# Patient Record
Sex: Female | Born: 1995 | Race: Black or African American | Hispanic: No | Marital: Single | Smoking: Never smoker
Health system: Southern US, Community
[De-identification: ages and names within clinical notes are randomized; demographics above are authoritative.]

## PROBLEM LIST (undated history)

## (undated) DIAGNOSIS — J45909 Unspecified asthma, uncomplicated: Secondary | ICD-10-CM

## (undated) DIAGNOSIS — J302 Other seasonal allergic rhinitis: Secondary | ICD-10-CM

## (undated) HISTORY — PX: TYMPANOSTOMY TUBE PLACEMENT: SHX32

---
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
## 2015-06-08 ENCOUNTER — Emergency Department (HOSPITAL_COMMUNITY)
Admission: EM | Admit: 2015-06-08 | Discharge: 2015-06-08 | Disposition: A | Discharge status: Home / Self Care | Payer: Commercial Managed Care - HMO | Attending: Emergency Medicine | Admitting: Emergency Medicine

## 2015-06-08 ENCOUNTER — Encounter (HOSPITAL_COMMUNITY): Payer: Self-pay | Admitting: Emergency Medicine

## 2015-06-08 ENCOUNTER — Emergency Department (HOSPITAL_COMMUNITY): Payer: Commercial Managed Care - HMO

## 2015-06-08 DIAGNOSIS — J45901 Unspecified asthma with (acute) exacerbation: Secondary | ICD-10-CM | POA: Diagnosis not present

## 2015-06-08 DIAGNOSIS — Z880 Allergy status to penicillin: Secondary | ICD-10-CM | POA: Diagnosis not present

## 2015-06-08 DIAGNOSIS — Z79899 Other long term (current) drug therapy: Secondary | ICD-10-CM | POA: Diagnosis not present

## 2015-06-08 DIAGNOSIS — Z7952 Long term (current) use of systemic steroids: Secondary | ICD-10-CM | POA: Insufficient documentation

## 2015-06-08 DIAGNOSIS — R000 Tachycardia, unspecified: Secondary | ICD-10-CM | POA: Diagnosis not present

## 2015-06-08 DIAGNOSIS — J45909 Unspecified asthma, uncomplicated: Secondary | ICD-10-CM | POA: Diagnosis present

## 2015-06-08 HISTORY — DX: Other seasonal allergic rhinitis: J30.2

## 2015-06-08 MED ORDER — IPRATROPIUM-ALBUTEROL 0.5-2.5 (3) MG/3ML IN SOLN
3.0000 mL | Freq: Once | RESPIRATORY_TRACT | Status: AC
  Administered 2015-06-08: 3 mL via RESPIRATORY_TRACT
  Filled 2015-06-08 (×2): qty 3

## 2015-06-08 MED ORDER — DEXAMETHASONE 4 MG PO TABS
10.0000 mg | ORAL_TABLET | Freq: Once | ORAL | Status: AC
  Administered 2015-06-08: 10 mg via ORAL
  Filled 2015-06-08: qty 2

## 2015-06-08 MED ORDER — ALBUTEROL SULFATE (2.5 MG/3ML) 0.083% IN NEBU: 2.5000 mg | INHALATION_SOLUTION | Freq: Four times a day (QID) | RESPIRATORY_TRACT | Status: AC | PRN

## 2015-06-08 NOTE — ED Notes (Signed)
Pt reports asthma flare-up since last night. Tried to use home nebulizer, but was out of nebulizer solution. Has been using home inhaler. No audible wheezing. Speaking in full sentences. Was worse this am before using home inhaler.

## 2015-06-08 NOTE — Discharge Instructions (Signed)
Asthma, Adult Asthma is a recurring condition in which the airways tighten and narrow. Asthma can make it difficult to breathe. It can cause coughing, wheezing, and shortness of breath. Asthma episodes, also called asthma attacks, range from minor to life-threatening. Asthma cannot be cured, but medicines and lifestyle changes can help control it. CAUSES Asthma is believed to be caused by inherited (genetic) and environmental factors, but its exact cause is unknown. Asthma may be triggered by allergens, lung infections, or irritants in the air. Asthma triggers are different for each person. Common triggers include:   Animal dander.  Dust mites.  Cockroaches.  Pollen from trees or grass.  Mold.  Smoke.  Air pollutants such as dust, household cleaners, hair sprays, aerosol sprays, paint fumes, strong chemicals, or strong odors.  Cold air, weather changes, and winds (which increase molds and pollens in the air).  Strong emotional expressions such as crying or laughing hard.  Stress.  Certain medicines (such as aspirin) or types of drugs (such as beta-blockers).  Sulfites in foods and drinks. Foods and drinks that may contain sulfites include dried fruit, potato chips, and sparkling grape juice.  Infections or inflammatory conditions such as the flu, a cold, or an inflammation of the nasal membranes (rhinitis).  Gastroesophageal reflux disease (GERD).  Exercise or strenuous activity. SYMPTOMS Symptoms may occur immediately after asthma is triggered or many hours later. Symptoms include:  Wheezing.  Excessive nighttime or early morning coughing.  Frequent or severe coughing with a common cold.  Chest tightness.  Shortness of breath. DIAGNOSIS  The diagnosis of asthma is made by a review of your medical history and a physical exam. Tests may also be performed. These may include:  Lung function studies. These tests show how much air you breathe in and out.  Allergy  tests.  Imaging tests such as X-rays. TREATMENT  Asthma cannot be cured, but it can usually be controlled. Treatment involves identifying and avoiding your asthma triggers. It also involves medicines. There are 2 classes of medicine used for asthma treatment:   Controller medicines. These prevent asthma symptoms from occurring. They are usually taken every day.  Reliever or rescue medicines. These quickly relieve asthma symptoms. They are used as needed and provide short-term relief. Your health care provider will help you create an asthma action plan. An asthma action plan is a written plan for managing and treating your asthma attacks. It includes a list of your asthma triggers and how they may be avoided. It also includes information on when medicines should be taken and when their dosage should be changed. An action plan may also involve the use of a device called a peak flow meter. A peak flow meter measures how well the lungs are working. It helps you monitor your condition. HOME CARE INSTRUCTIONS   Take medicines only as directed by your health care provider. Speak with your health care provider if you have questions about how or when to take the medicines.  Use a peak flow meter as directed by your health care provider. Record and keep track of readings.  Understand and use the action plan to help minimize or stop an asthma attack without needing to seek medical care.  Control your home environment in the following ways to help prevent asthma attacks:  Do not smoke. Avoid being exposed to secondhand smoke.  Change your heating and air conditioning filter regularly.  Limit your use of fireplaces and wood stoves.  Get rid of pests (such as roaches   and mice) and their droppings.  Throw away plants if you see mold on them.  Clean your floors and dust regularly. Use unscented cleaning products.  Try to have someone else vacuum for you regularly. Stay out of rooms while they are  being vacuumed and for a short while afterward. If you vacuum, use a dust mask from a hardware store, a double-layered or microfilter vacuum cleaner bag, or a vacuum cleaner with a HEPA filter.  Replace carpet with wood, tile, or vinyl flooring. Carpet can trap dander and dust.  Use allergy-proof pillows, mattress covers, and box spring covers.  Wash bed sheets and blankets every week in hot water and dry them in a dryer.  Use blankets that are made of polyester or cotton.  Clean bathrooms and kitchens with bleach. If possible, have someone repaint the walls in these rooms with mold-resistant paint. Keep out of the rooms that are being cleaned and painted.  Wash hands frequently. SEEK MEDICAL CARE IF:   You have wheezing, shortness of breath, or a cough even if taking medicine to prevent attacks.  The colored mucus you cough up (sputum) is thicker than usual.  Your sputum changes from clear or white to yellow, green, gray, or bloody.  You have any problems that may be related to the medicines you are taking (such as a rash, itching, swelling, or trouble breathing).  You are using a reliever medicine more than 2-3 times per week.  Your peak flow is still at 50-79% of your personal best after following your action plan for 1 hour.  You have a fever. SEEK IMMEDIATE MEDICAL CARE IF:   You seem to be getting worse and are unresponsive to treatment during an asthma attack.  You are short of breath even at rest.  You get short of breath when doing very Amanda Campbell physical activity.  You have difficulty eating, drinking, or talking due to asthma symptoms.  You develop chest pain.  You develop a fast heartbeat.  You have a bluish color to your lips or fingernails.  You are light-headed, dizzy, or faint.  Your peak flow is less than 50% of your personal best.   This information is not intended to replace advice given to you by your health care provider. Make sure you discuss any  questions you have with your health care provider.   Document Released: 02/16/2005 Document Revised: 11/07/2014 Document Reviewed: 09/15/2012 Elsevier Interactive Patient Education 2016 [employer]  

## 2015-06-08 NOTE — ED Notes (Signed)
Pt also reports hx of PNA with asthma flare-ups.

## 2015-06-08 NOTE — ED Provider Notes (Signed)
CSN: [id number]     Arrival date & time 06/08/15  4098 History   First MD Initiated Contact with Patient 06/08/15 0915     Chief Complaint  Patient presents with  . Asthma     (Consider location/radiation/quality/duration/timing/severity/associated sxs/prior Treatment) HPI Comments: 20 year old female with history of asthma and seasonal allergies who presents with asthma exacerbation. The patient states that yesterday she began feeling more short of breath consistent with asthma exacerbation. She was out of her nebulizer solution and has been using her home inhaler but felt more short of breath this morning around 7 AM which is what prompted her to come in. Her parents are concerned because she has a history of pneumonia in the setting of asthma. She endorses nasal congestion and seasonal allergy symptoms for which she is taking Claritin. She denies any fevers, significant cough, vomiting, or recent illness.  Patient is a 20 y.o. female presenting with asthma. The history is provided by the patient.  Asthma    Past Medical History  Diagnosis Date  . Asthma   . Seasonal allergies    No past surgical history on file. No family history on file. Social History  Substance Use Topics  . Smoking status: Never Smoker   . Smokeless tobacco: Not on file  . Alcohol Use: No   OB History    No data available     Review of Systems 10 Systems reviewed and are negative for acute change except as noted in the HPI.    Allergies  Amoxicillin  Home Medications   Prior to Admission medications   Medication Sig Start Date End Date Taking? Authorizing Provider  albuterol (PROVENTIL HFA;VENTOLIN HFA) 108 (90 BASE) MCG/ACT inhaler Inhale into the lungs every 6 (six) hours as needed for wheezing or shortness of breath.    Historical Provider, MD  cetirizine (ZYRTEC) 10 MG chewable tablet Chew 10 mg by mouth daily.    Historical Provider, MD  predniSONE (DELTASONE) 20 MG tablet Take 2 tablets (40  mg total) by mouth daily. 04/05/14   Charm Rings, MD   BP 126/82 mmHg  Pulse 97  Temp(Src) 98.3 F (36.8 C) (Oral)  Resp 20  SpO2 100% Physical Exam  Constitutional: She is oriented to person, place, and time. She appears well-developed and well-nourished. No distress.  HENT:  Head: Normocephalic and atraumatic.  Mouth/Throat: No oropharyngeal exudate.  Moist mucous membranes, + nasal congestion  Eyes: Conjunctivae are normal. Pupils are equal, round, and reactive to light.  Neck: Neck supple.  Cardiovascular: Regular rhythm and normal heart sounds.  Tachycardia present.   No murmur heard. Pulmonary/Chest: Effort normal and breath sounds normal. No respiratory distress.  Abdominal: Soft. Bowel sounds are normal. She exhibits no distension. There is no tenderness.  Musculoskeletal: She exhibits no edema.  Neurological: She is alert and oriented to person, place, and time.  Fluent speech  Skin: Skin is warm and dry.  Psychiatric: She has a normal mood and affect. Judgment normal.  Nursing note and vitals reviewed.   ED Course  Procedures (including critical care time) Labs Review Labs Reviewed - No data to display  Imaging Review No results found.   Medications  ipratropium-albuterol (DUONEB) 0.5-2.5 (3) MG/3ML nebulizer solution 3 mL (not administered)  dexamethasone (DECADRON) tablet 10 mg (not administered)     MDM   Final diagnoses:  None   Pt w/ hx asthma p/w wheezing and SOB c/w previous asthma flares. She was well-appearing on exam with normal vital  signs at triage. During my examination, she was tachycardic but she had used albuterol 1 hour prior and did state that she was nervous. She had normal work of breathing, no obvious wheezing but patient does endorse having audible wheezes earlier this morning prior to using the albuterol. Gave the patient DuoNeb and Decadron and obtained chest x-ray per patient request. CXR unremarkable.  Provided with refill on  albuterol nebulizer and instructed on using spacer when she does use her inhaler. Discussed supportive care instructions as well as return precautions. Patient voiced understanding and was discharged in satisfactory condition.   Laurence Spatesachel Morgan Little, MD 06/08/15 [phone]

## 2015-07-03 ENCOUNTER — Encounter (HOSPITAL_COMMUNITY): Payer: Self-pay | Admitting: *Deleted

## 2015-07-03 ENCOUNTER — Emergency Department (HOSPITAL_COMMUNITY)
Admission: EM | Admit: 2015-07-03 | Discharge: 2015-07-03 | Disposition: A | Discharge status: Home / Self Care | Payer: Commercial Managed Care - HMO | Attending: Emergency Medicine | Admitting: Emergency Medicine

## 2015-07-03 DIAGNOSIS — R062 Wheezing: Secondary | ICD-10-CM | POA: Diagnosis present

## 2015-07-03 DIAGNOSIS — H6691 Otitis media, unspecified, right ear: Secondary | ICD-10-CM | POA: Diagnosis not present

## 2015-07-03 DIAGNOSIS — Z880 Allergy status to penicillin: Secondary | ICD-10-CM | POA: Insufficient documentation

## 2015-07-03 DIAGNOSIS — J45901 Unspecified asthma with (acute) exacerbation: Secondary | ICD-10-CM | POA: Diagnosis not present

## 2015-07-03 DIAGNOSIS — Z79899 Other long term (current) drug therapy: Secondary | ICD-10-CM | POA: Diagnosis not present

## 2015-07-03 MED ORDER — CEFDINIR 300 MG PO CAPS: 300.0000 mg | ORAL_CAPSULE | Freq: Two times a day (BID) | ORAL | Status: AC

## 2015-07-03 MED ORDER — PREDNISONE 20 MG PO TABS
60.0000 mg | ORAL_TABLET | Freq: Once | ORAL | Status: AC
  Administered 2015-07-03: 60 mg via ORAL
  Filled 2015-07-03: qty 3

## 2015-07-03 MED ORDER — ALBUTEROL SULFATE (2.5 MG/3ML) 0.083% IN NEBU
5.0000 mg | INHALATION_SOLUTION | Freq: Once | RESPIRATORY_TRACT | Status: AC
  Administered 2015-07-03: 5 mg via RESPIRATORY_TRACT
  Filled 2015-07-03: qty 6

## 2015-07-03 MED ORDER — IPRATROPIUM BROMIDE 0.02 % IN SOLN
0.5000 mg | Freq: Once | RESPIRATORY_TRACT | Status: AC
  Administered 2015-07-03: 0.5 mg via RESPIRATORY_TRACT
  Filled 2015-07-03: qty 2.5

## 2015-07-03 MED ORDER — PREDNISONE 20 MG PO TABS: 40.0000 mg | ORAL_TABLET | Freq: Every day | ORAL | Status: AC

## 2015-07-03 NOTE — ED Notes (Signed)
Pt complains of asthma exacerbation for the past 2 days. Pt states she tried a home nebulizer treatment today at 1015, which she states helped but did not completely relieve her shortness of breath. Pt also complains of pain and fullness in her right ear starting this morning.

## 2015-07-03 NOTE — ED Provider Notes (Signed)
CSN: [id number]     Arrival date & time 07/03/15  1535 History  By signing my name below, I, Iona Beard, attest that this documentation has been prepared under the direction and in the presence of Sharilyn Sites, PA-C.  Electronically Signed: Iona Beard, ED Scribe 07/03/2015 at 5:14 PM.   Chief Complaint  Patient presents with  . Asthma    The history is provided by the patient. No language interpreter was used.   HPI Comments: Amanda Campbell is a 20 y.o. female with PMHx of asthma and seasonal allergies who presents to the Emergency Department complaining of gradual onset, worsening, wheezing and shortness of breath, ongoing for two days. Pt also complains of right ear pain beginning this morning. Pt states that her wheezing kept her from sleeping last night. No other associated symptoms noted. Pt used albuterol inhaler 5 times last night with no relief to symptoms. She also used nebulizer treatment at home last night and this morning with minimal relief to symptoms. No other worsening or alleviating factors noted. Pt denies chest pain, cough, sore throat, or any other pertinent symptoms.   Past Medical History  Diagnosis Date  . Asthma   . Seasonal allergies    History reviewed. No pertinent past surgical history. No family history on file. Social History  Substance Use Topics  . Smoking status: Never Smoker   . Smokeless tobacco: None  . Alcohol Use: No   OB History    No data available     Review of Systems  HENT: Positive for ear pain. Negative for sore throat.   Respiratory: Positive for shortness of breath and wheezing. Negative for cough.   Cardiovascular: Negative for chest pain.  All other systems reviewed and are negative.   Allergies  Amoxicillin  Home Medications   Prior to Admission medications   Medication Sig Start Date End Date Taking? Authorizing Provider  albuterol (PROVENTIL HFA;VENTOLIN HFA) 108 (90 BASE) MCG/ACT inhaler Inhale 1 puff into  the lungs every 6 (six) hours as needed for wheezing or shortness of breath.    Yes Historical Provider, MD  albuterol (PROVENTIL) (2.5 MG/3ML) 0.083% nebulizer solution Take 3 mLs (2.5 mg total) by nebulization every 6 (six) hours as needed for wheezing or shortness of breath. 06/08/15  Yes Laurence Spates, MD  cetirizine (ZYRTEC) 10 MG chewable tablet Chew 10 mg by mouth daily as needed for allergies.    Yes Historical Provider, MD  loratadine-pseudoephedrine (CLARITIN-D 24-HOUR) 10-240 MG 24 hr tablet Take 1 tablet by mouth daily as needed for allergies.    Yes Historical Provider, MD  predniSONE (DELTASONE) 20 MG tablet Take 2 tablets (40 mg total) by mouth daily. Patient not taking: Reported on 07/03/2015 04/05/14   Charm Rings, MD   BP 122/76 mmHg  Pulse 100  Temp(Src) 98.7 F (37.1 C) (Oral)  Resp 20  SpO2 95%  LMP 06/09/2015 Physical Exam  Constitutional: She is oriented to person, place, and time. She appears well-developed and well-nourished. No distress.  HENT:  Head: Normocephalic and atraumatic.  Right Ear: No mastoid tenderness. Tympanic membrane is erythematous.  Left Ear: Tympanic membrane and ear canal normal.  Nose: Mucosal edema present.  Mouth/Throat: Uvula is midline, oropharynx is clear and moist and mucous membranes are normal. No oropharyngeal exudate, posterior oropharyngeal edema, posterior oropharyngeal erythema or tonsillar abscesses.  Right otitis media, no mastoid tenderness Left ear normal + nasal congestion  Eyes: Conjunctivae and EOM are normal. Pupils are equal,[id number], and  reactive to light.  Neck: Normal range of motion.  Cardiovascular: Normal rate, regular rhythm and normal heart sounds.   Pulmonary/Chest: Effort normal. She has wheezes.  Mild end-expiratory wheezes noted, no distress, speaking in full sentences without difficulty  Abdominal: Soft. Bowel sounds are normal.  Musculoskeletal: Normal range of motion.  Neurological: She is alert and  oriented to person, place, and time.  Skin: Skin is warm and dry. She is not diaphoretic.  Psychiatric: She has a normal mood and affect.  Nursing note and vitals reviewed.   ED Course  Procedures (including critical care time) DIAGNOSTIC STUDIES: Oxygen Saturation is 95% on RA, adequate by my interpretation.    COORDINATION OF CARE: 5:13 PM Discussed treatment plan which includes proventil, atrovent, and deltasone with pt at bedside and pt agreed to plan.  Labs Review Labs Reviewed - No data to display  Imaging Review No results found.    EKG Interpretation None      MDM   Final diagnoses:  Asthma attack  Acute ear infection, right   20 year old female here with asthma exacerbation for the past 2 days. Allergies home nebulizer without improvement. She was given nebulizer treatment on arrival to ED with some improvement. She has mild end-expiratory wheezes noted on exam. Her vital signs are stable on room air and she is in no acute respiratory distress. She also has exam findings consistent with right otitis media. Patient was given additional albuterol and Atrovent nebulization treatment and dose of prednisone with resolution of symptoms. Her vital signs remained stable. She does have a low-grade tachycardia which I suspect is from the albuterol. Will discharge home with Omnicef and prednisone.  Recommend to continue home nebulizer treatments every 4-6 hours as needed for shortness of breath. Use albuterol inhaler for rescue. Follow-up with PCP.  Discussed plan with patient, he/she acknowledged understanding and agreed with plan of care.  Return precautions given for new or worsening symptoms.  I personally performed the services described in this documentation, which was scribed in my presence. The recorded information has been reviewed and is accurate.  Garlon HatchetLisa M Toni Hoffmeister, PA-C 07/03/15 [phone]

## 2015-07-03 NOTE — Discharge Instructions (Signed)
Take the prescribed medication as directed.  Continue using albuterol nebulizer treatments every 4-6 hours at home as needed.  Use rescue inhaler for break through. Follow-up with your primary care doctor. Return to the ED for new or worsening symptoms.

## 2015-09-12 NOTE — ED Provider Notes (Signed)
Medical screening examination/treatment/procedure(s) were performed by non-physician practitioner and as supervising physician I was immediately available for consultation/collaboration.   EKG Interpretation None       Cailen Mihalik, MD 09/12/15 1304 

## 2016-10-19 IMAGING — CR DG CHEST 2V
2 series · 2 of 2 positions shown · non-contrast
Comparison: April 05, 2014

CLINICAL DATA: Shortness of breath and cough

EXAM:
CHEST  2 VIEW

[w chest pa]
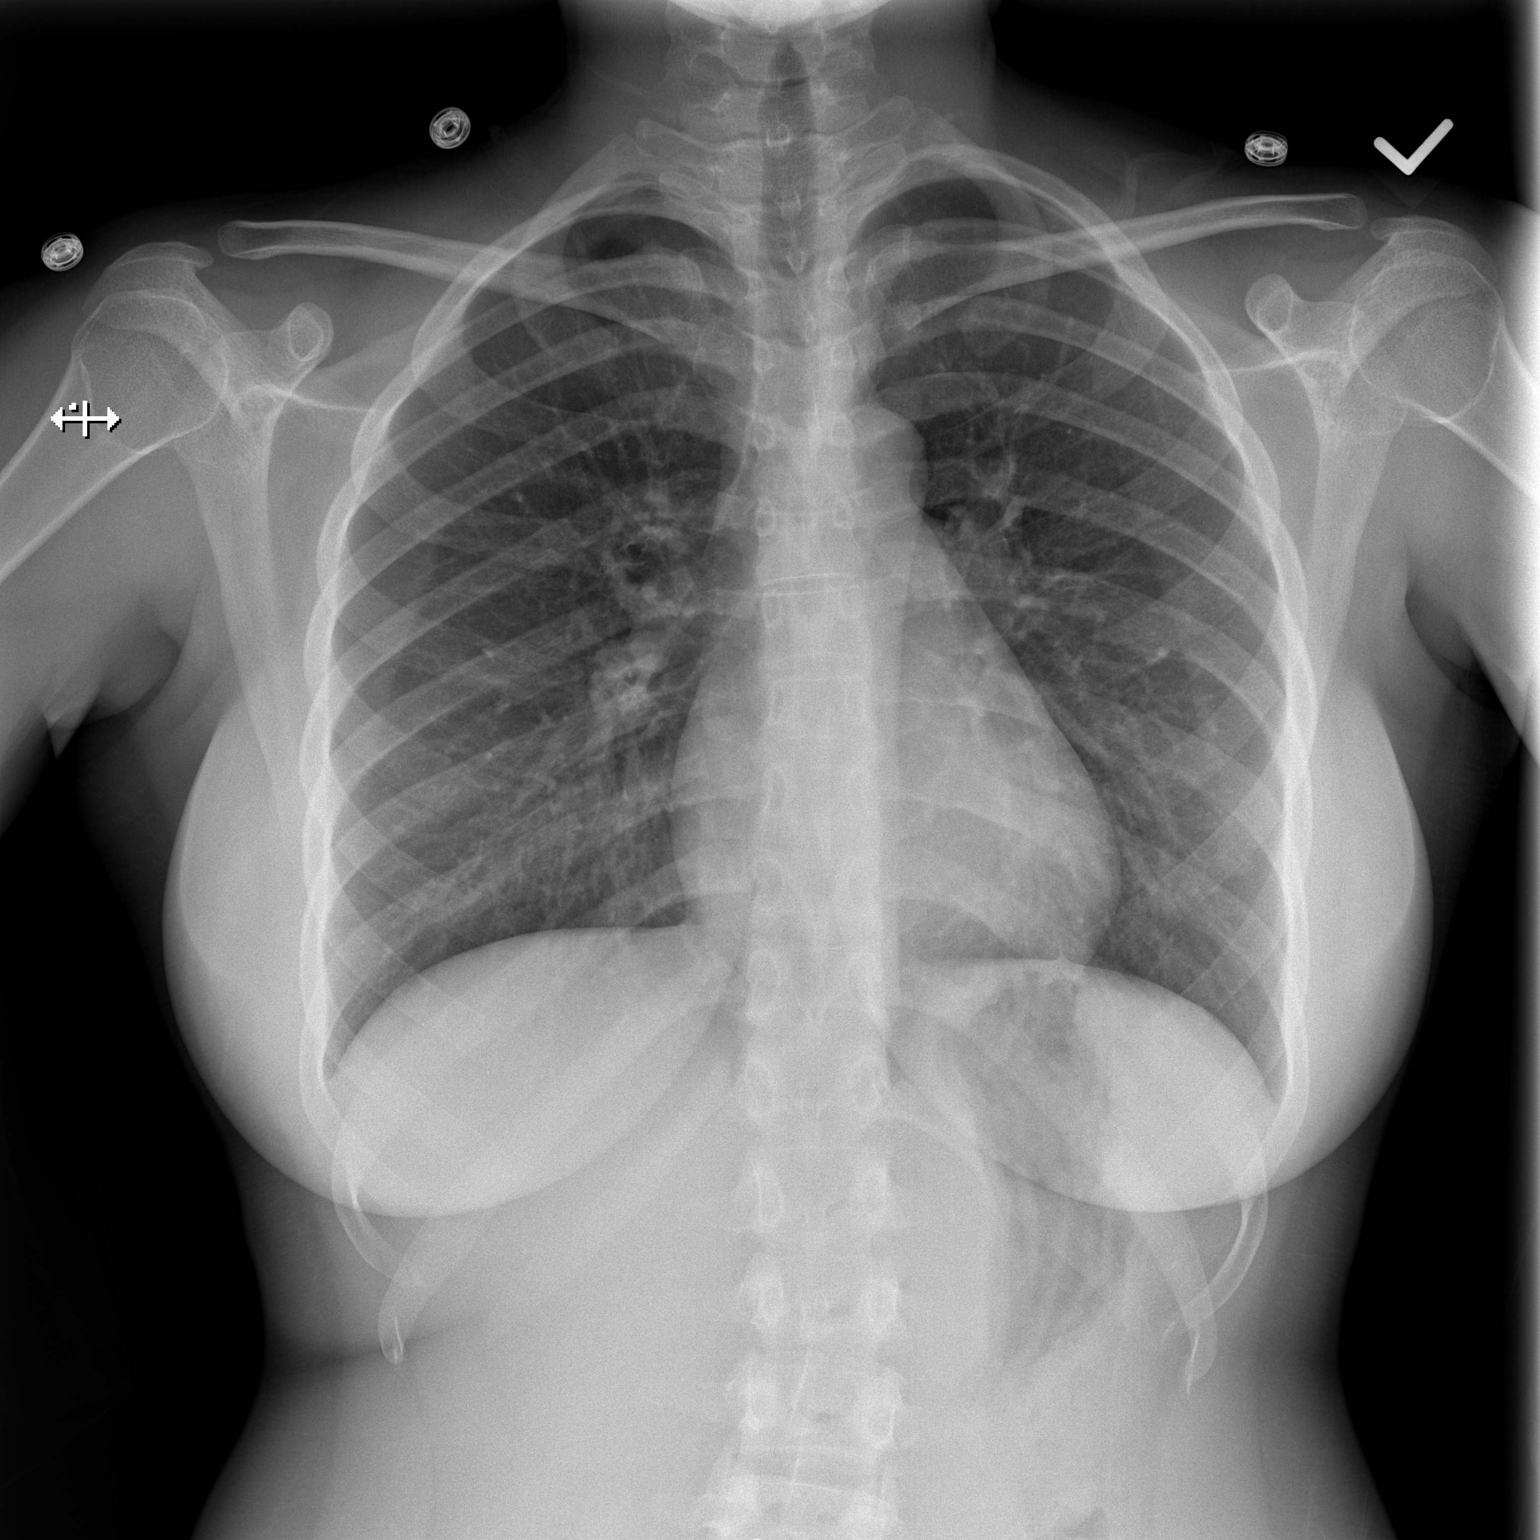

[w chest lat]
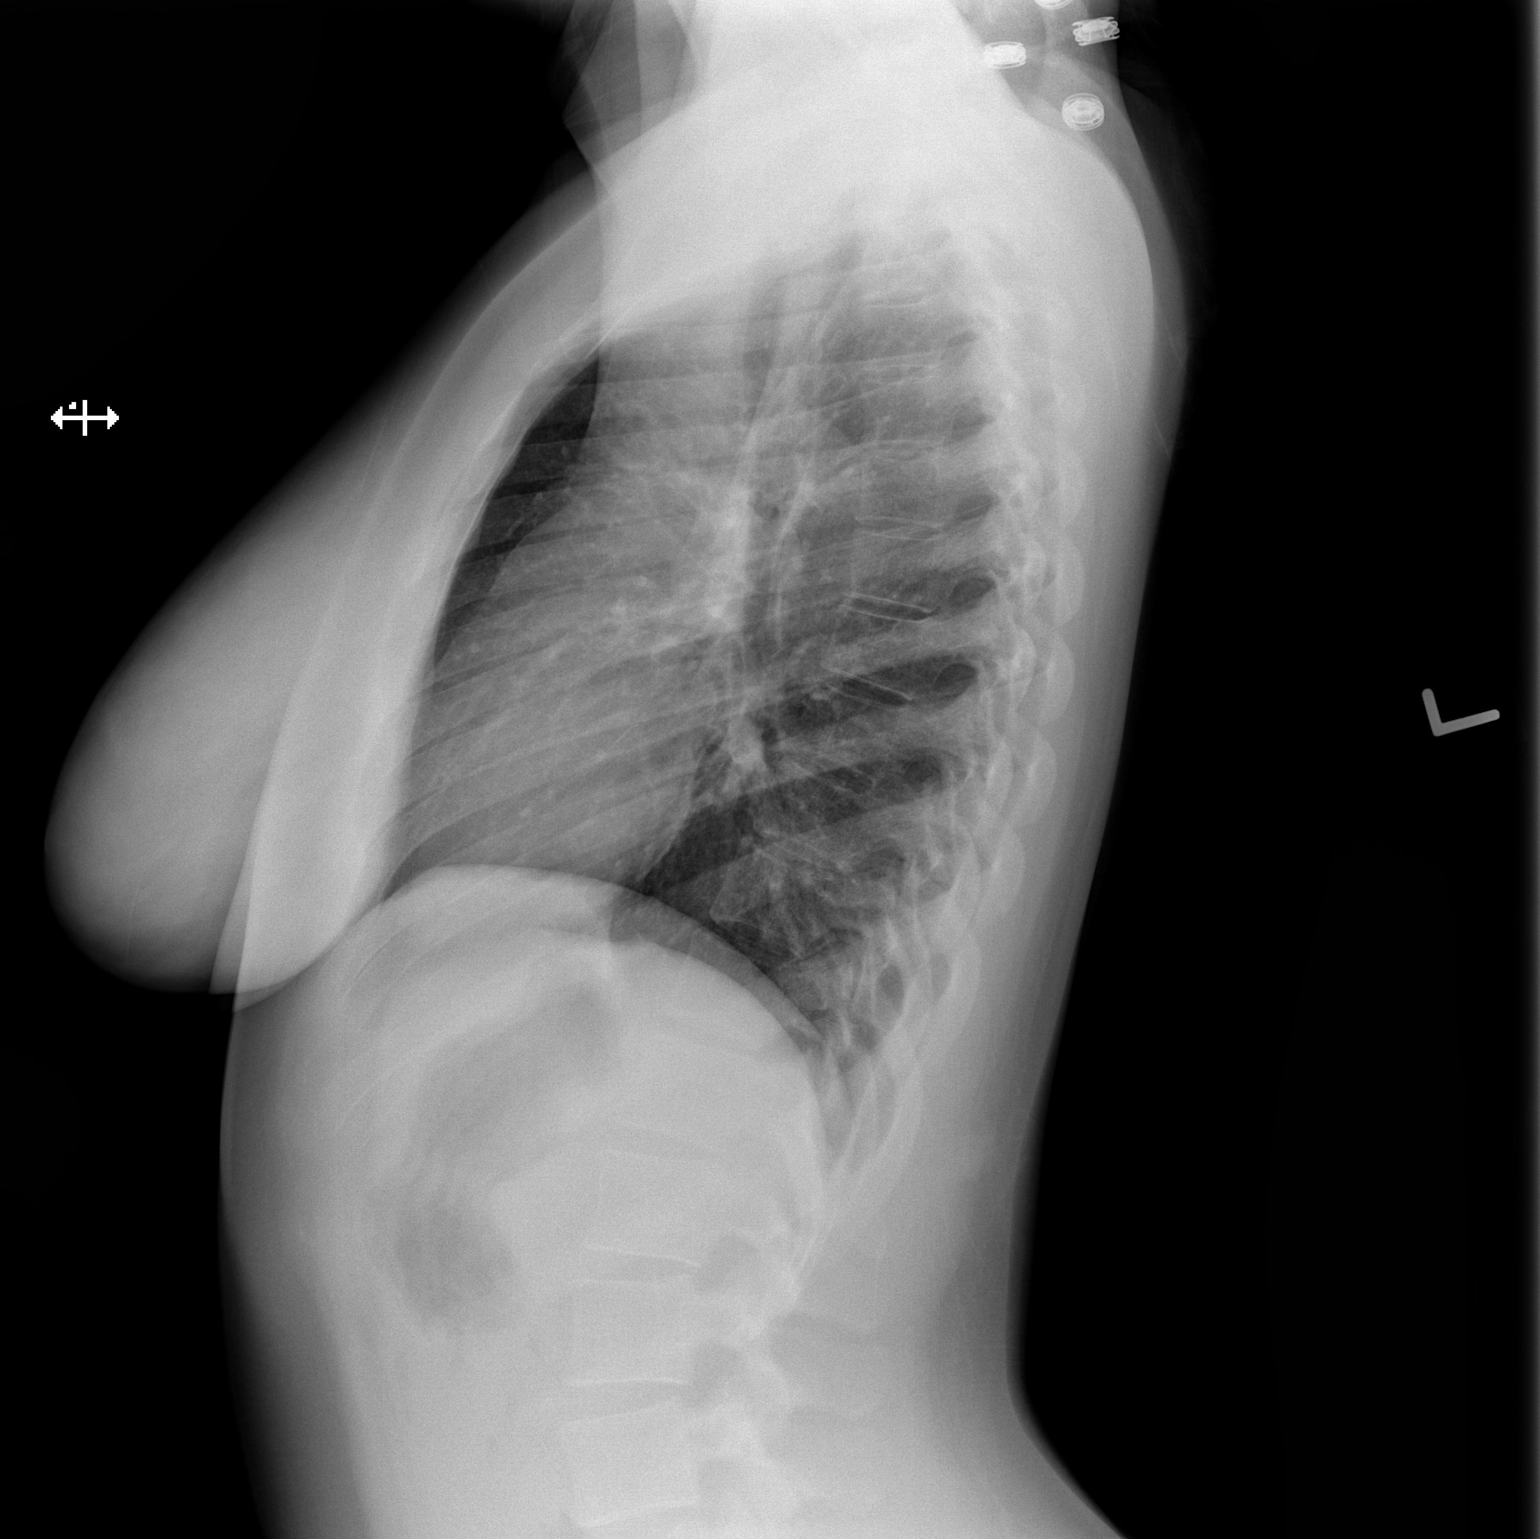

[2 of 2 positions shown; findings below may reference images not displayed]

FINDINGS: The heart size and mediastinal contours are within normal limits.
Both lungs are clear. The visualized skeletal structures are
unremarkable.
IMPRESSION: No active cardiopulmonary disease.

## 2016-10-30 ENCOUNTER — Emergency Department (HOSPITAL_COMMUNITY)
Admission: EM | Admit: 2016-10-30 | Discharge: 2016-10-30 | Disposition: A | Discharge status: Home / Self Care | Payer: Medicaid - Out of State | Attending: Emergency Medicine | Admitting: Emergency Medicine

## 2016-10-30 ENCOUNTER — Encounter (HOSPITAL_COMMUNITY): Payer: Self-pay

## 2016-10-30 DIAGNOSIS — J069 Acute upper respiratory infection, unspecified: Secondary | ICD-10-CM | POA: Diagnosis not present

## 2016-10-30 DIAGNOSIS — Z79899 Other long term (current) drug therapy: Secondary | ICD-10-CM | POA: Insufficient documentation

## 2016-10-30 DIAGNOSIS — B9789 Other viral agents as the cause of diseases classified elsewhere: Secondary | ICD-10-CM | POA: Diagnosis not present

## 2016-10-30 DIAGNOSIS — R0602 Shortness of breath: Secondary | ICD-10-CM | POA: Diagnosis present

## 2016-10-30 DIAGNOSIS — J4521 Mild intermittent asthma with (acute) exacerbation: Secondary | ICD-10-CM | POA: Diagnosis not present

## 2016-10-30 MED ORDER — OXYMETAZOLINE HCL 0.05 % NA SOLN: 1.0000 | Freq: Two times a day (BID) | NASAL | 0 refills | Status: AC

## 2016-10-30 MED ORDER — ALBUTEROL SULFATE HFA 108 (90 BASE) MCG/ACT IN AERS
1.0000 | INHALATION_SPRAY | Freq: Once | RESPIRATORY_TRACT | Status: AC
  Administered 2016-10-30: 1 via RESPIRATORY_TRACT
  Filled 2016-10-30: qty 6.7

## 2016-10-30 NOTE — Discharge Instructions (Signed)
Use your rescue inhaler as needed for shortness of breath. You may use Afrin twice daily for 2 days for nasal congestion but do not exceed 2 days of use. You may also use Flonase for your nasal congestion. He may use ibuprofen or Tylenol as needed for generalized aches and pains. Drink plenty of fluids and get plenty of rest. Follow up with a primary care physician if symptoms persist. Return to the ED immediately if any concerning signs or symptoms develop such as persistent shortness of breath or wheezing that is not improved with your inhaler, throat tightness, drooling, or difficulty swallowing.

## 2016-10-30 NOTE — ED Notes (Signed)
Bed: WTR8 Expected date:  Expected time:  Means of arrival:  Comments: 

## 2016-10-30 NOTE — ED Triage Notes (Addendum)
Pt c/o nasal congestion/drainage, throat irritation, and chest tightness x 3-4 days.  Pain score 3/10.  Pt reports taking OTC medications.  Hx of asthma and seasonal allergies.      Pt would specifically like her ears checked.  Sts she is prone to ear infections.

## 2016-10-30 NOTE — ED Provider Notes (Signed)
[facility]-EMERGENCY DEPT Provider Note   CSN: [id number] Arrival date & time: 10/30/16  [id number]     History   Chief Complaint Chief Complaint  Patient presents with  . URI    HPI Amanda Campbell is a 21 y.o. female with history of asthma and seasonal allergies who presents today with chief complaint acute onset, progressively improving nasal congestion and shortness of breath. She states that symptoms began 4 days ago with nasal congestion and a "scratchy throat", for which she has been using NyQuil and over-the-counter decongestant with some relief. Last night she developed chest tightness and shortness of breath for which a nebulizer treatment was helpful. She denies fevers, chills, CP, ear pain, throat tightness, or drooling. She is requesting a refill of her albuterol inhaler.   The history is provided by the patient.    Past Medical History:  Diagnosis Date  . Asthma   . Seasonal allergies     There are no active problems to display for this patient.   Past Surgical History:  Procedure Laterality Date  . TYMPANOSTOMY TUBE PLACEMENT      OB History    No data available       Home Medications    Prior to Admission medications   Medication Sig Start Date End Date Taking? Authorizing Provider  albuterol (PROVENTIL HFA;VENTOLIN HFA) 108 (90 BASE) MCG/ACT inhaler Inhale 1 puff into the lungs every 6 (six) hours as needed for wheezing or shortness of breath.     [provider]  albuterol (PROVENTIL) (2.5 MG/3ML) 0.083% nebulizer solution Take 3 mLs (2.5 mg total) by nebulization every 6 (six) hours as needed for wheezing or shortness of breath. 06/08/15   Little, Ambrose Finlandachel Morgan, MD  cefdinir (OMNICEF) 300 MG capsule Take 1 capsule (300 mg total) by mouth 2 (two) times daily. 07/03/15   Garlon HatchetSanders, Lisa M, PA-C  cetirizine (ZYRTEC) 10 MG chewable tablet Chew 10 mg by mouth daily as needed for allergies.     [provider]  loratadine-pseudoephedrine (CLARITIN-D  24-HOUR) 10-240 MG 24 hr tablet Take 1 tablet by mouth daily as needed for allergies.     [provider]  oxymetazoline (AFRIN NASAL SPRAY) 0.05 % nasal spray Place 1 spray into both nostrils 2 (two) times daily. 10/30/16 11/01/16  Michela PitcherFawze, Kotaro Buer A, PA-C  predniSONE (DELTASONE) 20 MG tablet Take 2 tablets (40 mg total) by mouth daily. 07/03/15   Garlon HatchetSanders, Lisa M, PA-C    Family History History reviewed. No pertinent family history.  Social History Social History  Substance Use Topics  . Smoking status: Never Smoker  . Smokeless tobacco: Never Used  . Alcohol use Yes     Comment: occ     Allergies   Amoxicillin   Review of Systems Review of Systems  Constitutional: Negative for chills and fever.  HENT: Positive for congestion and sneezing. Negative for ear pain, sinus pain, sinus pressure, sore throat and trouble swallowing.   Respiratory: Positive for cough, chest tightness and shortness of breath.   Cardiovascular: Negative for chest pain.  Gastrointestinal: Negative for abdominal pain.  Neurological: Negative for headaches.  All other systems reviewed and are negative.    Physical Exam Updated Vital Signs BP 117/81 (BP Location: Right Arm)   Pulse 95   Temp 98.1 F (36.7 C) (Oral)   Resp 16   LMP 10/14/2016   SpO2 97%   Physical Exam  Constitutional: She appears well-developed and well-nourished. No distress.  HENT:  Head: Normocephalic and  atraumatic.  Right Ear: External ear normal.  Left Ear: External ear normal.  Mouth/Throat: Oropharynx is clear and moist.  TMs normal without erythema or bulging. No frontal or maxillary sinus TTP. Posterior oropharynx without tonsillar hypertrophy, exudates, uvular deviation, or erythema. Postnasal drip is noted. Nasal septum is midline with pale pink mucosa and nasal congestion bilaterally. No trismus or sublingual abnormalities  Eyes: Pupils are equal, round, and reactive to light. Conjunctivae are normal. Right eye  exhibits no discharge. Left eye exhibits no discharge.  Neck: Normal range of motion. Neck supple. No JVD present. No tracheal deviation present.  Cardiovascular: Normal rate, regular rhythm and normal heart sounds.  Exam reveals no gallop and no friction rub.   No murmur heard. Pulmonary/Chest: Effort normal and breath sounds normal. No respiratory distress. She has no wheezes. She has no rales. She exhibits no tenderness.  Equal rise and fall of chest, no increased work of breathing  Abdominal: She exhibits no distension.  Musculoskeletal: She exhibits no edema.  Lymphadenopathy:    She has no cervical adenopathy.  Neurological: She is alert.  Skin: Skin is warm and dry. No erythema.  Psychiatric: She has a normal mood and affect. Her behavior is normal.  Nursing note and vitals reviewed.    ED Treatments / Results  Labs (all labs ordered are listed, but only abnormal results are displayed) Labs Reviewed - No data to display  EKG  EKG Interpretation None       Radiology No results found.  Procedures Procedures (including critical care time)  Medications Ordered in ED Medications  albuterol (PROVENTIL HFA;VENTOLIN HFA) 108 (90 Base) MCG/ACT inhaler 1 puff (1 puff Inhalation Given 10/30/16 1115)     Initial Impression / Assessment and Plan / ED Course  I have reviewed the triage vital signs and the nursing notes.  Pertinent labs & imaging results that were available during my care of the patient were reviewed by me and considered in my medical decision making (see chart for details).     Patients symptoms are consistent with URI, likely viral etiology, with mild asthma exacerbation. Afebrile, vital signs are stable, and SPO2 is 97% on room air. No wheezing on auscultation. Discussed that antibiotics are not indicated for viral infections. Pt will be discharged with symptomatic treatment as well as refill of albuterol inhaler. Recurrent follow-up with primary care if  symptoms persist. Discussed indications for return to the ED.  Verbalizes understanding and is agreeable with plan. Pt is hemodynamically stable & in NAD prior to dc.   Final Clinical Impressions(s) / ED Diagnoses   Final diagnoses:  Viral URI with cough  Mild intermittent asthma with exacerbation    New Prescriptions New Prescriptions   OXYMETAZOLINE (AFRIN NASAL SPRAY) 0.05 % NASAL SPRAY    Place 1 spray into both nostrils 2 (two) times daily.     Jeanie Sewer, PA-C 10/30/16 1116    Linwood Dibbles, MD 10/31/16 [phone]

## 2017-10-30 ENCOUNTER — Other Ambulatory Visit: Payer: Self-pay

## 2017-10-30 ENCOUNTER — Encounter (HOSPITAL_COMMUNITY): Payer: Self-pay

## 2017-10-30 ENCOUNTER — Emergency Department (HOSPITAL_COMMUNITY)
Admission: EM | Admit: 2017-10-30 | Discharge: 2017-10-30 | Disposition: A | Discharge status: Home / Self Care | Payer: Medicaid - Out of State | Attending: Emergency Medicine | Admitting: Emergency Medicine

## 2017-10-30 ENCOUNTER — Ambulatory Visit (HOSPITAL_COMMUNITY)
Admission: EM | Admit: 2017-10-30 | Discharge: 2017-10-30 | Discharge status: Absent without leave | Payer: PRIVATE HEALTH INSURANCE | Attending: Family Medicine | Admitting: Family Medicine

## 2017-10-30 DIAGNOSIS — J45909 Unspecified asthma, uncomplicated: Secondary | ICD-10-CM | POA: Insufficient documentation

## 2017-10-30 DIAGNOSIS — J309 Allergic rhinitis, unspecified: Secondary | ICD-10-CM | POA: Diagnosis not present

## 2017-10-30 DIAGNOSIS — Z79899 Other long term (current) drug therapy: Secondary | ICD-10-CM | POA: Diagnosis not present

## 2017-10-30 DIAGNOSIS — J029 Acute pharyngitis, unspecified: Secondary | ICD-10-CM | POA: Insufficient documentation

## 2017-10-30 DIAGNOSIS — M791 Myalgia, unspecified site: Secondary | ICD-10-CM | POA: Diagnosis not present

## 2017-10-30 LAB — POC URINE PREG, ED: PREG TEST UR: NEGATIVE

## 2017-10-30 LAB — MONONUCLEOSIS SCREEN: MONO SCREEN: NEGATIVE

## 2017-10-30 LAB — GROUP A STREP BY PCR: GROUP A STREP BY PCR: NOT DETECTED

## 2017-10-30 MED ORDER — OLOPATADINE HCL 0.1 % OP SOLN: 1.0000 [drp] | Freq: Two times a day (BID) | OPHTHALMIC | 12 refills | Status: DC

## 2017-10-30 MED ORDER — MONTELUKAST SODIUM 10 MG PO TABS: 10.0000 mg | ORAL_TABLET | Freq: Every day | ORAL | 0 refills | Status: AC

## 2017-10-30 MED ORDER — FLUTICASONE PROPIONATE 50 MCG/ACT NA SUSP: 1.0000 | Freq: Every day | NASAL | 2 refills | Status: AC

## 2017-10-30 MED ORDER — MONTELUKAST SODIUM 10 MG PO TABS: 10.0000 mg | ORAL_TABLET | Freq: Every day | ORAL | 0 refills | Status: DC

## 2017-10-30 MED ORDER — OLOPATADINE HCL 0.1 % OP SOLN: 1.0000 [drp] | Freq: Two times a day (BID) | OPHTHALMIC | 0 refills | Status: AC

## 2017-10-30 NOTE — ED Provider Notes (Signed)
[facility]-EMERGENCY DEPT Provider Note   CSN: [id number] Arrival date & time: 10/30/17  1244     History   Chief Complaint Chief Complaint  Patient presents with  . Sore Throat    HPI Amanda Campbell is a 22 y.o. female.  HPI  Patient is a 22 year old female with a history of asthma and allergic rhinitis presenting for sore throat, generalized body aches, nasal congestion rhinorrhea, and itchy eyes for a couple hours.  Patient reports that her symptoms of sore throat began 5 days ago, and has persisted without relief.  Patient denies any difficulty breathing, difficulty swallowing, neck induration or tenderness.  Patient denies any fevers, but reports she has felt chilled at night.  Patient reports taking her cetirizine per prescription, but no other remedies.  Patient also notes that she has had generalized body aches over this interval.  Patient reports that she has persistent nasal congestion and rhinorrhea, she has allergic rhinitis, and this has not worsened or improved.  Patient also notes that her partner has had watery eye drainage and erythematous eyes ever since dust particles were started in their apartment.  She reports she has had similar symptoms today in the right eye, but has not had any drainage from the eye, or sensation of eyes matted shut in the morning.  Patient denies any recent exposure to similar sore throat or myalgias. Patient does report orla sexual intercourse with multiple female sexual partners.   Past Medical History:  Diagnosis Date  . Asthma   . Seasonal allergies     There are no active problems to display for this patient.   Past Surgical History:  Procedure Laterality Date  . TYMPANOSTOMY TUBE PLACEMENT       OB History   None      Home Medications    Prior to Admission medications   Medication Sig Start Date End Date Taking? Authorizing Provider  albuterol (PROVENTIL HFA;VENTOLIN HFA) 108 (90 BASE) MCG/ACT inhaler  Inhale 1 puff into the lungs every 6 (six) hours as needed for wheezing or shortness of breath.     [provider]  albuterol (PROVENTIL) (2.5 MG/3ML) 0.083% nebulizer solution Take 3 mLs (2.5 mg total) by nebulization every 6 (six) hours as needed for wheezing or shortness of breath. 06/08/15   Little, Ambrose Finland, MD  cefdinir (OMNICEF) 300 MG capsule Take 1 capsule (300 mg total) by mouth 2 (two) times daily. 07/03/15   Garlon Hatchet, PA-C  cetirizine (ZYRTEC) 10 MG chewable tablet Chew 10 mg by mouth daily as needed for allergies.     [provider]  loratadine-pseudoephedrine (CLARITIN-D 24-HOUR) 10-240 MG 24 hr tablet Take 1 tablet by mouth daily as needed for allergies.     [provider]  predniSONE (DELTASONE) 20 MG tablet Take 2 tablets (40 mg total) by mouth daily. 07/03/15   Garlon Hatchet, PA-C    Family History No family history on file.  Social History Social History   Tobacco Use  . Smoking status: Never Smoker  . Smokeless tobacco: Never Used  Substance Use Topics  . Alcohol use: Yes    Comment: occ  . Drug use: No     Allergies   Amoxicillin   Review of Systems Review of Systems  Constitutional: Positive for chills and fatigue. Negative for fever.  HENT: Positive for congestion, rhinorrhea and sore throat. Negative for dental problem, trouble swallowing and voice change.   Respiratory: Negative for cough.  Gastrointestinal: Negative for abdominal pain, nausea and vomiting.  Musculoskeletal: Positive for myalgias.  Skin: Negative for rash.  Neurological: Negative for weakness.     Physical Exam Updated Vital Signs BP 120/86 (BP Location: Right Arm)   Pulse (!) 101   Temp 98.2 F (36.8 C) (Oral)   Resp 20   Ht 5\' 3"  (1.6 m)   Wt 65.8 kg   LMP 10/12/2017   SpO2 100%   BMI 25.69 kg/m   Physical Exam  Constitutional: She appears well-developed and well-nourished. No distress.  Sitting comfortably in bed.  HENT:    Head: Normocephalic and atraumatic.  Mouth/Throat: Tonsils are 2+ on the right. Tonsils are 2+ on the left.  Normal phonation. No muffled voice sounds. Patient swallows secretions without difficulty. Dentition normal. No lesions of tongue or buccal mucosa. Uvula midline. No asymmetric swelling of the posterior pharynx.Erythema of posterior pharynx. Small white patch within right tonsil, erythema versus exudate. No lingual swelling. No induration inferior to tongue. No submandibular tenderness, swelling, or induration.  Tissues of the neck supple. Posterior cervical lymphadenopathy present. Right TM without erythema or effusion; left TM without erythema or effusion.  Eyes: Conjunctivae are normal. Right eye exhibits no discharge. Left eye exhibits no discharge.  EOMs normal to gross examination. Patient exhibits mild conjunctival erythema with chemosis present of the right eye.  Neck: Normal range of motion.  Cardiovascular: Normal rate, regular rhythm and normal heart sounds.  No murmur heard. Pulmonary/Chest: Effort normal and breath sounds normal. She has no wheezes. She has no rales.  Normal respiratory effort. Patient converses comfortably. No audible wheeze or stridor.  Abdominal: She exhibits no distension.  No splenic enlargement palpated.  Musculoskeletal: Normal range of motion.  Neurological: She is alert.  Cranial nerves intact to gross observation. Patient moves extremities without difficulty.  Skin: Skin is warm and dry. She is not diaphoretic.  Psychiatric: She has a normal mood and affect. Her behavior is normal. Judgment and thought content normal.  Nursing note and vitals reviewed.    ED Treatments / Results  Labs (all labs ordered are listed, but only abnormal results are displayed) Labs Reviewed  GROUP A STREP BY PCR  MONONUCLEOSIS SCREEN  POC URINE PREG, ED  GC/CHLAMYDIA PROBE AMP ([place]) NOT AT [facility]    EKG None  Radiology No results  found.  Procedures Procedures (including critical care time)  Medications Ordered in ED Medications - No data to display   Initial Impression / Assessment and Plan / ED Course  I have reviewed the triage vital signs and the nursing notes.  Pertinent labs & imaging results that were available during my care of the patient were reviewed by me and considered in my medical decision making (see chart for details).      Patient is nontoxic-appearing, afebrile, and in no acute distress.  Diagnosis includes viral pharyngitis, mononucleosis, eustachian tube dysfunction with postnasal drip.  Patient does have posterior cervical adenopathy with evidence of pharyngitis.  I discussed with patient etiology mononucleosis, as well as treating empirically by refraining from contact sports, and refraining from sharing secretions.  Monospot ordered, discussed with patient sensitivities positive human, and also offered to follow my chart.  Also swabbed for GC/CT, as patient reports she has performed oral sex with multiple sexual partners.  Will also increase patient's allergic rhinitis regimen given significant symptoms.  Patient also incidentally noting some increased right eye erythema today.  Given that is all been occurring for a couple hours,  patient has had no drainage, unclear if this could be early conjunctivitis.  I discussed with patient treating with olopatadine drops, she has significant allergic rhinitis, and if it is progressive, she needs to be reevaluated for conjunctivitis treatment.  Patient can return precautions for any fevers, chills, difficulty with difficulty swallowing, new or worsening symptoms.  Patient is in understanding and agrees with the plan of care.  This is a supervised visit with Dr. Kristine RoyalPeter Messick. Evaluation, management, and discharge planning discussed with this attending physician.  Final Clinical Impressions(s) / ED Diagnoses   Final diagnoses:  Sore throat  Myalgia   Allergic rhinitis, unspecified seasonality, unspecified trigger    ED Discharge Orders         Ordered    olopatadine (PATANOL) 0.1 % ophthalmic solution  2 times daily     10/30/17 1522    montelukast (SINGULAIR) 10 MG tablet  Daily at bedtime     10/30/17 1522    fluticasone (FLONASE) 50 MCG/ACT nasal spray  Daily     10/30/17 1522           Delia ChimesMurray, Ceaser Ebeling B, PA-C 10/30/17 1523    Wynetta FinesMessick, Peter C, MD 10/30/17 1537

## 2017-10-30 NOTE — ED Notes (Signed)
Bed: WTR9 Expected date:  Expected time:  Means of arrival:  Comments: 

## 2017-10-30 NOTE — ED Triage Notes (Signed)
t states she has had a sore throat and been "achy" in her jaw, neck and body.Pt states she has been taking Excedrin, which has provided some relief

## 2017-10-30 NOTE — Discharge Instructions (Addendum)
Please read and follow all provided instructions.  Your diagnoses today include:  1. Sore throat   2. Myalgia   3. Allergic rhinitis, unspecified seasonality, unspecified trigger     You appear to have an upper respiratory infection (URI).  However, some your symptoms may be as with mononucleosis, as you have enlarged lymph nodes in the back of your neck, as well as a persistent sore throat and you are in an environment at high risk of exposure to mononucleosis.    Tests performed today include: Vital signs. See below for your results today.  We tested you for mononucleosis today, but it will not be available until later today.  Please follow this on my chart.  This is a viral disease caused by Epstein-Barr virus, which is very common in our environment.  Your body will treated on its own, and there is no medication that can be taken for it. Your gonorrhea and Chlamydia testing for the back of the throat will be available in 48 to 72 hours.  Anything positive will receive a call, but negative results he would like you to call for. Strep test negative today. Pregnancy test negative today.  Medications prescribed:   Take any prescribed medications only as directed. Treatment for your infection is aimed at treating the symptoms. There are no medications, such as antibiotics, that will cure your infection.   Please place 1 drop of the olopatadine solution in your right eye twice a day for 7 days.  If your eye is worsening with increasing drainage, or your eye is matted shut in the morning, please be evaluated by her student clinic for conjunctivitis or pinkeye.  Please begin taking montelukast once daily in addition to cetirizine.  This is a medication for allergy.  Please begin taking Flonase, 1 spray in each nostril in the morning.  Home care instructions:  Follow any educational materials contained in this packet.   Your illness is contagious and can be spread to others, especially  during the first 3 or 4 days. It cannot be cured by antibiotics or other medicines. Take basic precautions such as washing your hands often, covering your mouth when you cough or sneeze, and avoiding public places where you could spread your illness to others.   Please continue drinking plenty of fluids.  Use over-the-counter medicines as needed as directed on packaging for symptom relief.  You may also use ibuprofen or tylenol as directed on packaging for pain or fever.  Do not take multiple medicines containing Tylenol or acetaminophen to avoid taking too much of this medication.  Instructions for possible Mononucleosis: Mononucleosis is both difficult to diagnose and contain.  Just because you have a negative mononucleosis screen in the first week of illness is not me that you are negative for the disease.  If you are still feeling poorly in 2 to 3 weeks, please be reevaluated at your student clinic for a retest.  Individuals who possibly have mononucleosis should not be performing contact sports for 4 to 6 weeks, as this could put you at increased risk of injury to the spleen.  Please refrain from sharing saliva, water bottles, or food utensils as long as you are having symptoms.  Mononucleosis is spread predominantly through close contact with saliva.  Follow-up instructions: Please follow-up with your primary care provider in the next 3 days for further evaluation of your symptoms if you are not feeling better.   Please follow-up in 1 to 2 weeks at the student  clinic if you are continuing to persist having similar symptoms.  You may need a re-test for mononucleosis.  Return instructions:  Please return to the Emergency Department if you experience worsening symptoms.  RETURN IMMEDIATELY IF you develop shortness of breath, chest pain, confusion or altered mental status, a new rash, become dizzy, faint, or poorly responsive, or are unable to be cared for at home. Please return if you have  persistent vomiting and cannot keep down fluids or develop a fever that is not controlled by tylenol or motrin.   Please return if you are having worsening eye redness, swelling of the eyes, or drainage that is green or yellow from the eyes. Please return if you have any other emergent concerns.  Additional Information:  Your vital signs today were: BP 120/86 (BP Location: Right Arm)    Pulse (!) 101    Temp 98.2 F (36.8 C) (Oral)    Resp 20    Ht 5\' 3"  (1.6 m)    Wt 65.8 kg    LMP 10/12/2017    SpO2 100%    BMI 25.69 kg/m  If your blood pressure (BP) was elevated above 135/85 this visit, please have this repeated by your doctor within one month. --------------

## 2017-11-02 LAB — GC/CHLAMYDIA PROBE AMP (~~LOC~~) NOT AT ARMC
CHLAMYDIA, DNA PROBE: NEGATIVE
Neisseria Gonorrhea: NEGATIVE
